# Patient Record
Sex: Female | Born: 1970 | Hispanic: No
Health system: Southern US, Community
[De-identification: ages and names within clinical notes are randomized; demographics above are authoritative.]

---

## 2010-03-09 ENCOUNTER — Encounter: Admission: RE | Admit: 2010-03-09 | Discharge: 2010-03-09 | Payer: Self-pay | Admitting: Infectious Diseases

## 2011-05-28 IMAGING — CR DG CHEST 2V
2 series · 2 of 2 positions shown · non-contrast
Comparison: None.

CLINICAL DATA: History of abnormal chest x-ray

CHEST - 2 VIEW

[w chest pa]
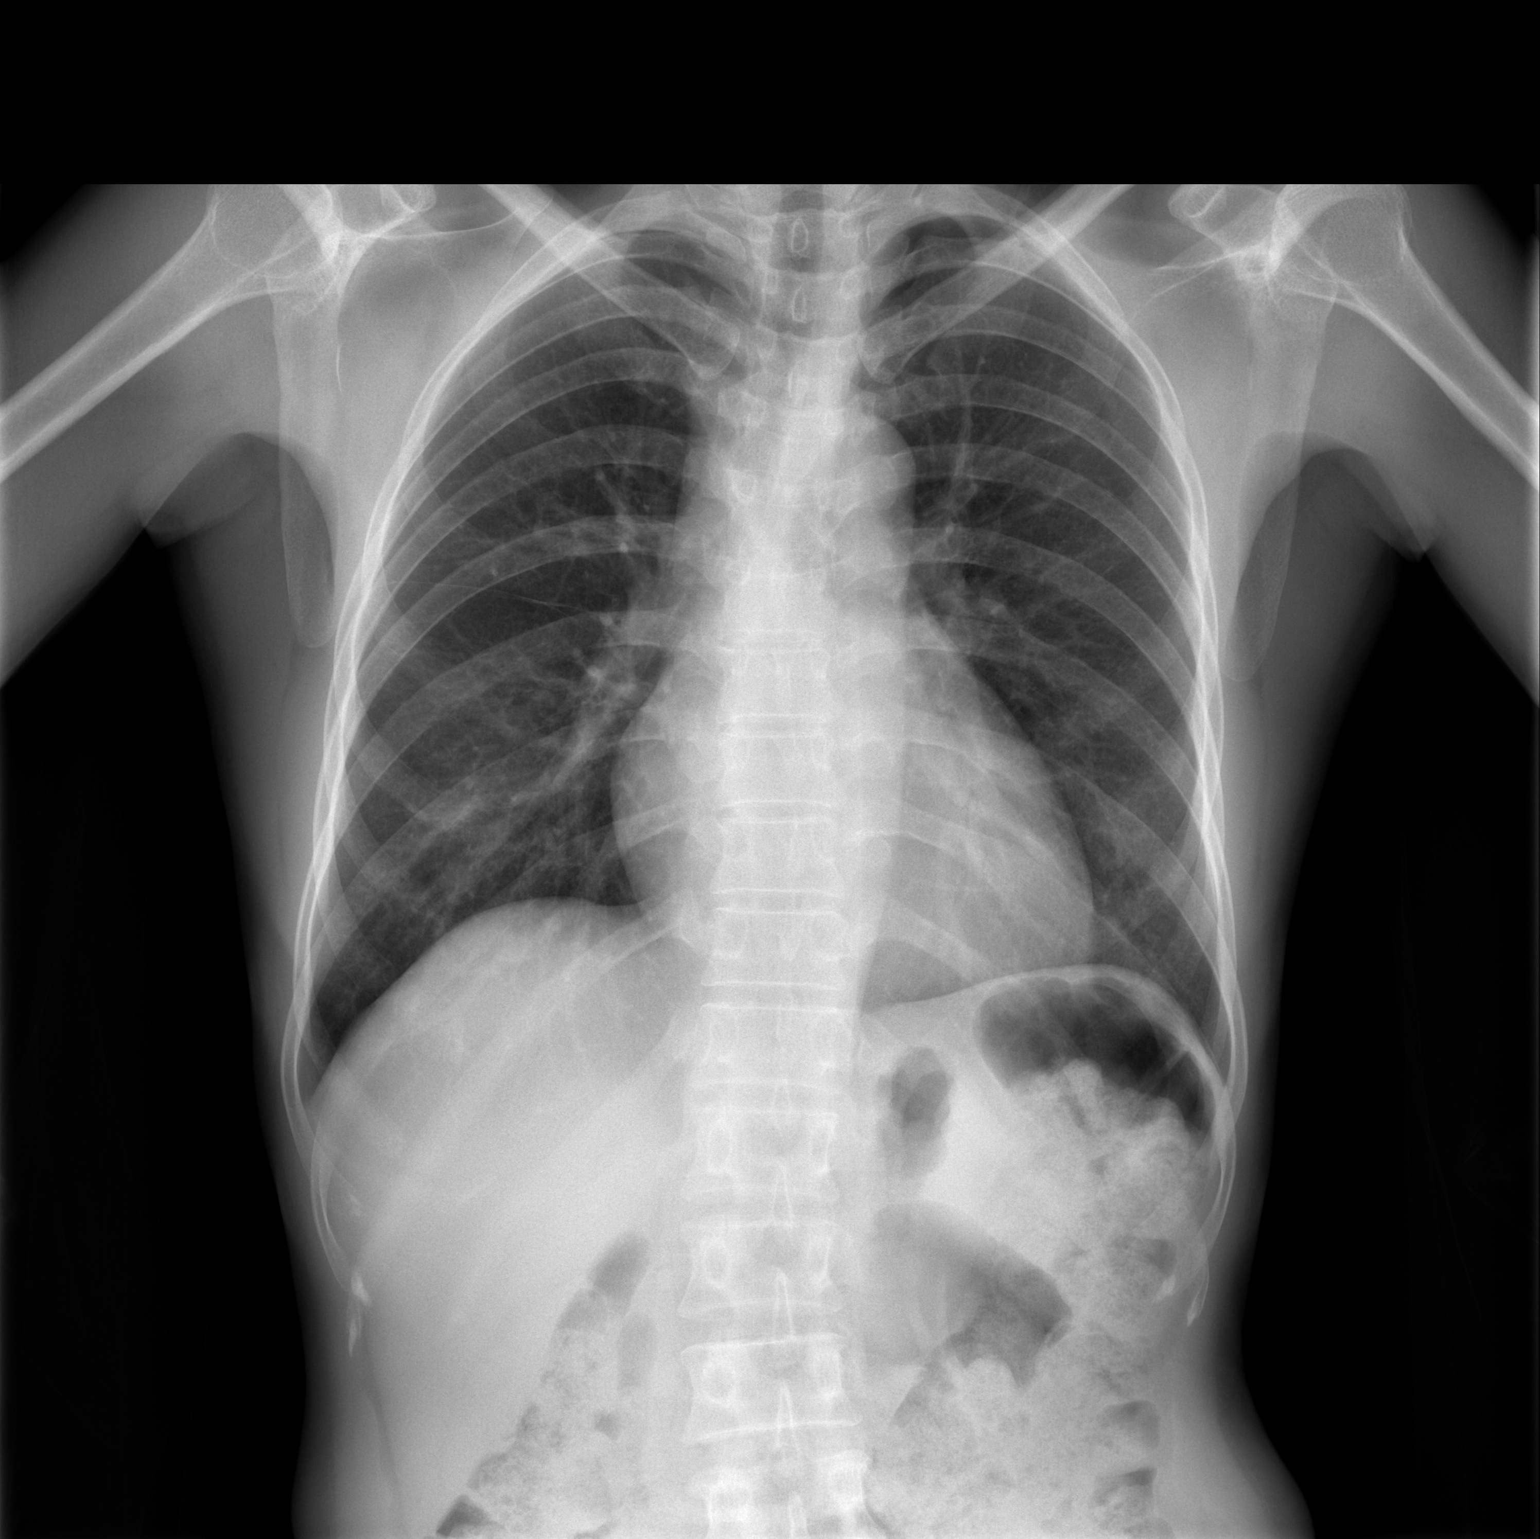

[w chest lat]
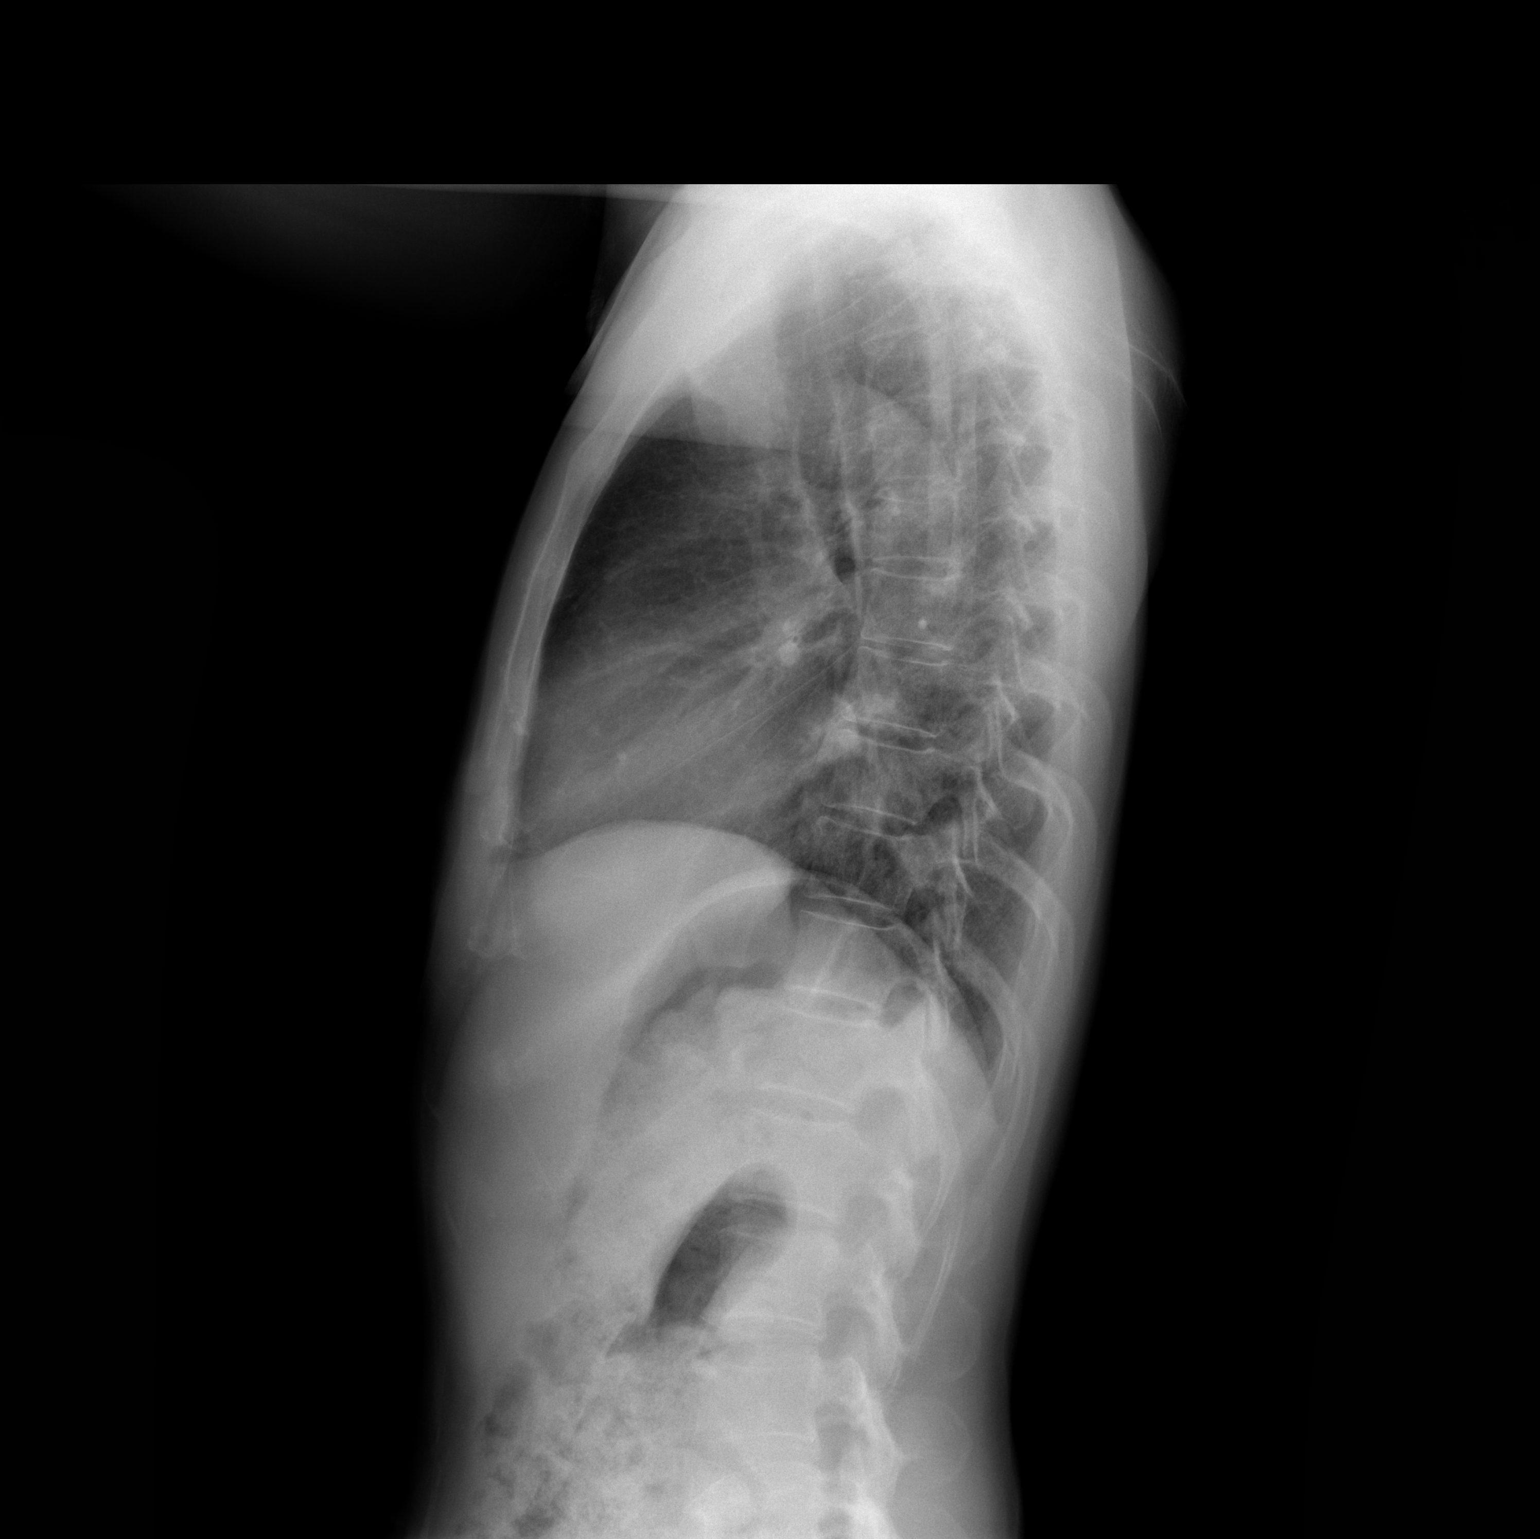

[2 of 2 positions shown; findings below may reference images not displayed]

FINDINGS: No active infiltrate or effusion is seen.  The
mediastinal contours appear normal.  The heart is within upper
limits of normal.  No acute bony abnormality is seen.
IMPRESSION: No active lung disease.  Borderline cardiomegaly.

## 2022-02-24 ENCOUNTER — Ambulatory Visit (INDEPENDENT_AMBULATORY_CARE_PROVIDER_SITE_OTHER): Payer: Self-pay | Admitting: Family Medicine

## 2022-02-24 ENCOUNTER — Encounter: Payer: Self-pay | Admitting: Family Medicine

## 2022-02-24 DIAGNOSIS — F411 Generalized anxiety disorder: Secondary | ICD-10-CM
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
